# Patient Record
Sex: Female | Born: 1970 | Race: White | Hispanic: No | Marital: Married | Smoking: Never smoker
Health system: Southern US, Community
[De-identification: ages and names within clinical notes are randomized; demographics above are authoritative.]

## PROBLEM LIST (undated history)

## (undated) DIAGNOSIS — D649 Anemia, unspecified: Secondary | ICD-10-CM

## (undated) DIAGNOSIS — E079 Disorder of thyroid, unspecified: Secondary | ICD-10-CM

---

## 1997-07-22 ENCOUNTER — Other Ambulatory Visit: Admission: RE | Admit: 1997-07-22 | Discharge: 1997-07-22 | Payer: Self-pay | Admitting: Obstetrics & Gynecology

## 1998-06-02 ENCOUNTER — Other Ambulatory Visit: Admission: RE | Admit: 1998-06-02 | Discharge: 1998-06-02 | Payer: Self-pay | Admitting: Obstetrics and Gynecology

## 1998-08-10 ENCOUNTER — Other Ambulatory Visit: Admission: RE | Admit: 1998-08-10 | Discharge: 1998-08-10 | Payer: Self-pay | Admitting: Obstetrics and Gynecology

## 1999-04-07 ENCOUNTER — Inpatient Hospital Stay (HOSPITAL_COMMUNITY): Admission: AD | Admit: 1999-04-07 | Discharge: 1999-04-07 | Payer: Self-pay | Admitting: Obstetrics and Gynecology

## 1999-06-22 ENCOUNTER — Inpatient Hospital Stay (HOSPITAL_COMMUNITY): Admission: AD | Admit: 1999-06-22 | Discharge: 1999-06-25 | Payer: Self-pay | Admitting: Obstetrics and Gynecology

## 1999-07-28 ENCOUNTER — Other Ambulatory Visit: Admission: RE | Admit: 1999-07-28 | Discharge: 1999-07-28 | Payer: Self-pay | Admitting: Obstetrics and Gynecology

## 1999-09-14 ENCOUNTER — Other Ambulatory Visit: Admission: RE | Admit: 1999-09-14 | Discharge: 1999-09-14 | Payer: Self-pay | Admitting: Obstetrics and Gynecology

## 2000-02-02 ENCOUNTER — Other Ambulatory Visit: Admission: RE | Admit: 2000-02-02 | Discharge: 2000-02-02 | Payer: Self-pay | Admitting: Obstetrics and Gynecology

## 2001-03-05 ENCOUNTER — Other Ambulatory Visit: Admission: RE | Admit: 2001-03-05 | Discharge: 2001-03-05 | Payer: Self-pay | Admitting: Obstetrics and Gynecology

## 2002-07-21 ENCOUNTER — Inpatient Hospital Stay (HOSPITAL_COMMUNITY): Admission: AD | Admit: 2002-07-21 | Discharge: 2002-07-24 | Payer: Self-pay | Admitting: Obstetrics and Gynecology

## 2002-07-25 ENCOUNTER — Encounter: Admission: RE | Admit: 2002-07-25 | Discharge: 2002-08-24 | Payer: Self-pay | Admitting: Obstetrics and Gynecology

## 2002-09-03 ENCOUNTER — Other Ambulatory Visit: Admission: RE | Admit: 2002-09-03 | Discharge: 2002-09-03 | Payer: Self-pay | Admitting: Obstetrics and Gynecology

## 2002-09-24 ENCOUNTER — Encounter: Admission: RE | Admit: 2002-09-24 | Discharge: 2002-10-24 | Payer: Self-pay | Admitting: Obstetrics and Gynecology

## 2002-11-24 ENCOUNTER — Encounter: Admission: RE | Admit: 2002-11-24 | Discharge: 2002-12-24 | Payer: Self-pay | Admitting: Obstetrics and Gynecology

## 2003-12-02 ENCOUNTER — Other Ambulatory Visit: Admission: RE | Admit: 2003-12-02 | Discharge: 2003-12-02 | Payer: Self-pay | Admitting: Obstetrics and Gynecology

## 2004-11-02 ENCOUNTER — Encounter: Admission: RE | Admit: 2004-11-02 | Discharge: 2004-11-02 | Payer: Self-pay | Admitting: Obstetrics and Gynecology

## 2004-12-06 ENCOUNTER — Encounter (HOSPITAL_COMMUNITY): Admission: RE | Admit: 2004-12-06 | Discharge: 2005-03-06 | Payer: Self-pay | Admitting: Endocrinology

## 2005-02-14 ENCOUNTER — Other Ambulatory Visit: Admission: RE | Admit: 2005-02-14 | Discharge: 2005-02-14 | Payer: Self-pay | Admitting: Obstetrics and Gynecology

## 2010-04-24 ENCOUNTER — Encounter: Payer: Self-pay | Admitting: Endocrinology

## 2011-07-10 ENCOUNTER — Other Ambulatory Visit: Payer: Self-pay | Admitting: Obstetrics and Gynecology

## 2011-08-04 ENCOUNTER — Other Ambulatory Visit: Payer: Self-pay | Admitting: Obstetrics and Gynecology

## 2011-08-04 DIAGNOSIS — R928 Other abnormal and inconclusive findings on diagnostic imaging of breast: Secondary | ICD-10-CM

## 2011-08-14 ENCOUNTER — Ambulatory Visit
Admission: RE | Admit: 2011-08-14 | Discharge: 2011-08-14 | Disposition: A | Discharge status: Home / Self Care | Payer: BC Managed Care – PPO | Source: Ambulatory Visit | Attending: Obstetrics and Gynecology | Admitting: Obstetrics and Gynecology

## 2011-08-14 DIAGNOSIS — R928 Other abnormal and inconclusive findings on diagnostic imaging of breast: Secondary | ICD-10-CM

## 2011-11-24 ENCOUNTER — Other Ambulatory Visit: Payer: Self-pay | Admitting: Plastic Surgery

## 2012-07-23 ENCOUNTER — Other Ambulatory Visit: Payer: Self-pay | Admitting: Obstetrics and Gynecology

## 2012-08-06 ENCOUNTER — Other Ambulatory Visit (HOSPITAL_COMMUNITY): Payer: Self-pay | Admitting: Endocrinology

## 2012-08-06 DIAGNOSIS — E0590 Thyrotoxicosis, unspecified without thyrotoxic crisis or storm: Secondary | ICD-10-CM

## 2012-08-21 ENCOUNTER — Encounter (HOSPITAL_COMMUNITY)
Admission: RE | Admit: 2012-08-21 | Discharge: 2012-08-21 | Disposition: A | Discharge status: Home / Self Care | Payer: BC Managed Care – PPO | Source: Ambulatory Visit | Attending: Endocrinology | Admitting: Endocrinology

## 2012-08-21 DIAGNOSIS — E0590 Thyrotoxicosis, unspecified without thyrotoxic crisis or storm: Secondary | ICD-10-CM | POA: Insufficient documentation

## 2012-08-22 ENCOUNTER — Encounter (HOSPITAL_COMMUNITY): Payer: Self-pay

## 2012-08-22 ENCOUNTER — Encounter (HOSPITAL_COMMUNITY)
Admission: RE | Admit: 2012-08-22 | Discharge: 2012-08-22 | Disposition: A | Discharge status: Home / Self Care | Payer: BC Managed Care – PPO | Source: Ambulatory Visit | Attending: Endocrinology | Admitting: Endocrinology

## 2012-08-22 DIAGNOSIS — E0590 Thyrotoxicosis, unspecified without thyrotoxic crisis or storm: Secondary | ICD-10-CM | POA: Insufficient documentation

## 2012-08-22 MED ORDER — SODIUM IODIDE I 131 CAPSULE
14.9000 | Freq: Once | INTRAVENOUS | Status: AC | PRN
  Administered 2012-08-22: 14.9 via ORAL

## 2012-08-22 MED ORDER — SODIUM PERTECHNETATE TC 99M INJECTION
10.0000 | Freq: Once | INTRAVENOUS | Status: AC | PRN
  Administered 2012-08-22: 10 via INTRAVENOUS

## 2012-09-04 ENCOUNTER — Other Ambulatory Visit (HOSPITAL_COMMUNITY): Payer: Self-pay | Admitting: Endocrinology

## 2012-09-04 DIAGNOSIS — E0590 Thyrotoxicosis, unspecified without thyrotoxic crisis or storm: Secondary | ICD-10-CM

## 2012-09-16 ENCOUNTER — Encounter (HOSPITAL_COMMUNITY)
Admission: RE | Admit: 2012-09-16 | Discharge: 2012-09-16 | Disposition: A | Discharge status: Home / Self Care | Payer: BC Managed Care – PPO | Source: Ambulatory Visit | Attending: Endocrinology | Admitting: Endocrinology

## 2012-09-16 DIAGNOSIS — E0590 Thyrotoxicosis, unspecified without thyrotoxic crisis or storm: Secondary | ICD-10-CM | POA: Insufficient documentation

## 2012-09-16 MED ORDER — SODIUM IODIDE I 131 CAPSULE
20.9000 | Freq: Once | INTRAVENOUS | Status: AC | PRN
  Administered 2012-09-16: 20.9 via ORAL

## 2013-07-24 ENCOUNTER — Ambulatory Visit
Admission: RE | Admit: 2013-07-24 | Discharge: 2013-07-24 | Disposition: A | Discharge status: Home / Self Care | Payer: PRIVATE HEALTH INSURANCE | Source: Ambulatory Visit | Attending: Obstetrics and Gynecology | Admitting: Obstetrics and Gynecology

## 2013-07-24 ENCOUNTER — Encounter (INDEPENDENT_AMBULATORY_CARE_PROVIDER_SITE_OTHER): Payer: Self-pay

## 2013-07-24 ENCOUNTER — Other Ambulatory Visit: Payer: Self-pay | Admitting: Obstetrics and Gynecology

## 2013-07-24 DIAGNOSIS — N6320 Unspecified lump in the left breast, unspecified quadrant: Principal | ICD-10-CM

## 2013-07-24 DIAGNOSIS — N6325 Unspecified lump in the left breast, overlapping quadrants: Secondary | ICD-10-CM

## 2013-09-23 ENCOUNTER — Other Ambulatory Visit: Payer: Self-pay | Admitting: Obstetrics and Gynecology

## 2013-09-24 LAB — CYTOLOGY - PAP

## 2014-10-14 ENCOUNTER — Other Ambulatory Visit: Payer: Self-pay | Admitting: Obstetrics and Gynecology

## 2014-10-15 LAB — CYTOLOGY - PAP

## 2015-03-05 ENCOUNTER — Other Ambulatory Visit: Payer: Self-pay | Admitting: Plastic Surgery

## 2015-07-08 DIAGNOSIS — E890 Postprocedural hypothyroidism: Secondary | ICD-10-CM | POA: Diagnosis not present

## 2015-07-08 DIAGNOSIS — E611 Iron deficiency: Secondary | ICD-10-CM | POA: Diagnosis not present

## 2015-07-20 DIAGNOSIS — J301 Allergic rhinitis due to pollen: Secondary | ICD-10-CM | POA: Diagnosis not present

## 2015-07-20 DIAGNOSIS — J3081 Allergic rhinitis due to animal (cat) (dog) hair and dander: Secondary | ICD-10-CM | POA: Diagnosis not present

## 2015-07-20 DIAGNOSIS — J3089 Other allergic rhinitis: Secondary | ICD-10-CM | POA: Diagnosis not present

## 2015-08-12 DIAGNOSIS — J3081 Allergic rhinitis due to animal (cat) (dog) hair and dander: Secondary | ICD-10-CM | POA: Diagnosis not present

## 2015-08-12 DIAGNOSIS — J3089 Other allergic rhinitis: Secondary | ICD-10-CM | POA: Diagnosis not present

## 2015-08-12 DIAGNOSIS — J301 Allergic rhinitis due to pollen: Secondary | ICD-10-CM | POA: Diagnosis not present

## 2015-08-18 DIAGNOSIS — J3081 Allergic rhinitis due to animal (cat) (dog) hair and dander: Secondary | ICD-10-CM | POA: Diagnosis not present

## 2015-08-18 DIAGNOSIS — J301 Allergic rhinitis due to pollen: Secondary | ICD-10-CM | POA: Diagnosis not present

## 2015-08-18 DIAGNOSIS — J3089 Other allergic rhinitis: Secondary | ICD-10-CM | POA: Diagnosis not present

## 2015-09-07 ENCOUNTER — Other Ambulatory Visit: Payer: Self-pay | Admitting: Obstetrics and Gynecology

## 2015-09-07 DIAGNOSIS — N63 Unspecified lump in unspecified breast: Secondary | ICD-10-CM

## 2015-09-14 ENCOUNTER — Ambulatory Visit
Admission: RE | Admit: 2015-09-14 | Discharge: 2015-09-14 | Disposition: A | Discharge status: Home / Self Care | Payer: BLUE CROSS/BLUE SHIELD | Source: Ambulatory Visit | Attending: Obstetrics and Gynecology | Admitting: Obstetrics and Gynecology

## 2015-09-14 DIAGNOSIS — N63 Unspecified lump in unspecified breast: Secondary | ICD-10-CM

## 2015-09-14 DIAGNOSIS — R922 Inconclusive mammogram: Secondary | ICD-10-CM | POA: Diagnosis not present

## 2015-11-23 DIAGNOSIS — D225 Melanocytic nevi of trunk: Secondary | ICD-10-CM | POA: Diagnosis not present

## 2015-11-23 DIAGNOSIS — D2271 Melanocytic nevi of right lower limb, including hip: Secondary | ICD-10-CM | POA: Diagnosis not present

## 2015-11-23 DIAGNOSIS — Z86018 Personal history of other benign neoplasm: Secondary | ICD-10-CM | POA: Diagnosis not present

## 2015-11-23 DIAGNOSIS — D2272 Melanocytic nevi of left lower limb, including hip: Secondary | ICD-10-CM | POA: Diagnosis not present

## 2015-12-14 DIAGNOSIS — Z01419 Encounter for gynecological examination (general) (routine) without abnormal findings: Secondary | ICD-10-CM | POA: Diagnosis not present

## 2015-12-14 DIAGNOSIS — Z6822 Body mass index (BMI) 22.0-22.9, adult: Secondary | ICD-10-CM | POA: Diagnosis not present

## 2015-12-14 DIAGNOSIS — D509 Iron deficiency anemia, unspecified: Secondary | ICD-10-CM | POA: Diagnosis not present

## 2015-12-28 DIAGNOSIS — D696 Thrombocytopenia, unspecified: Secondary | ICD-10-CM | POA: Diagnosis not present

## 2016-02-21 DIAGNOSIS — E890 Postprocedural hypothyroidism: Secondary | ICD-10-CM | POA: Diagnosis not present

## 2016-05-18 DIAGNOSIS — E890 Postprocedural hypothyroidism: Secondary | ICD-10-CM | POA: Diagnosis not present

## 2016-05-25 DIAGNOSIS — E890 Postprocedural hypothyroidism: Secondary | ICD-10-CM | POA: Diagnosis not present

## 2016-05-25 DIAGNOSIS — E611 Iron deficiency: Secondary | ICD-10-CM | POA: Diagnosis not present

## 2016-08-01 DIAGNOSIS — R7301 Impaired fasting glucose: Secondary | ICD-10-CM | POA: Diagnosis not present

## 2016-08-01 DIAGNOSIS — E611 Iron deficiency: Secondary | ICD-10-CM | POA: Diagnosis not present

## 2016-08-01 DIAGNOSIS — E890 Postprocedural hypothyroidism: Secondary | ICD-10-CM | POA: Diagnosis not present

## 2017-01-01 DIAGNOSIS — Z6821 Body mass index (BMI) 21.0-21.9, adult: Secondary | ICD-10-CM | POA: Diagnosis not present

## 2017-01-01 DIAGNOSIS — Z1212 Encounter for screening for malignant neoplasm of rectum: Secondary | ICD-10-CM | POA: Diagnosis not present

## 2017-01-01 DIAGNOSIS — D2271 Melanocytic nevi of right lower limb, including hip: Secondary | ICD-10-CM | POA: Diagnosis not present

## 2017-01-01 DIAGNOSIS — Z1231 Encounter for screening mammogram for malignant neoplasm of breast: Secondary | ICD-10-CM | POA: Diagnosis not present

## 2017-01-01 DIAGNOSIS — D225 Melanocytic nevi of trunk: Secondary | ICD-10-CM | POA: Diagnosis not present

## 2017-01-01 DIAGNOSIS — D2272 Melanocytic nevi of left lower limb, including hip: Secondary | ICD-10-CM | POA: Diagnosis not present

## 2017-01-01 DIAGNOSIS — Z01419 Encounter for gynecological examination (general) (routine) without abnormal findings: Secondary | ICD-10-CM | POA: Diagnosis not present

## 2017-01-01 DIAGNOSIS — Z86018 Personal history of other benign neoplasm: Secondary | ICD-10-CM | POA: Diagnosis not present

## 2017-05-17 DIAGNOSIS — E611 Iron deficiency: Secondary | ICD-10-CM | POA: Diagnosis not present

## 2017-05-17 DIAGNOSIS — E890 Postprocedural hypothyroidism: Secondary | ICD-10-CM | POA: Diagnosis not present

## 2017-06-11 DIAGNOSIS — E890 Postprocedural hypothyroidism: Secondary | ICD-10-CM | POA: Diagnosis not present

## 2017-06-11 DIAGNOSIS — E611 Iron deficiency: Secondary | ICD-10-CM | POA: Diagnosis not present

## 2018-01-07 ENCOUNTER — Other Ambulatory Visit: Payer: Self-pay | Admitting: Obstetrics and Gynecology

## 2018-01-07 DIAGNOSIS — R928 Other abnormal and inconclusive findings on diagnostic imaging of breast: Secondary | ICD-10-CM

## 2018-01-09 ENCOUNTER — Ambulatory Visit
Admission: RE | Admit: 2018-01-09 | Discharge: 2018-01-09 | Disposition: A | Discharge status: Home / Self Care | Payer: BLUE CROSS/BLUE SHIELD | Source: Ambulatory Visit | Attending: Obstetrics and Gynecology | Admitting: Obstetrics and Gynecology

## 2018-01-09 DIAGNOSIS — R928 Other abnormal and inconclusive findings on diagnostic imaging of breast: Secondary | ICD-10-CM

## 2018-01-21 ENCOUNTER — Other Ambulatory Visit (HOSPITAL_COMMUNITY): Payer: Self-pay

## 2018-01-22 ENCOUNTER — Ambulatory Visit (HOSPITAL_COMMUNITY): Payer: BLUE CROSS/BLUE SHIELD

## 2018-01-25 ENCOUNTER — Encounter (HOSPITAL_COMMUNITY): Payer: BLUE CROSS/BLUE SHIELD

## 2019-11-24 ENCOUNTER — Other Ambulatory Visit: Payer: Self-pay | Admitting: Physician Assistant

## 2019-11-24 ENCOUNTER — Telehealth: Payer: Self-pay | Admitting: Physician Assistant

## 2019-11-24 ENCOUNTER — Ambulatory Visit (HOSPITAL_COMMUNITY)
Admission: RE | Admit: 2019-11-24 | Discharge: 2019-11-24 | Disposition: A | Discharge status: Home / Self Care | Payer: BC Managed Care – PPO | Source: Ambulatory Visit | Attending: Pulmonary Disease | Admitting: Pulmonary Disease

## 2019-11-24 DIAGNOSIS — U071 COVID-19: Secondary | ICD-10-CM

## 2019-11-24 DIAGNOSIS — E079 Disorder of thyroid, unspecified: Secondary | ICD-10-CM

## 2019-11-24 DIAGNOSIS — J4520 Mild intermittent asthma, uncomplicated: Secondary | ICD-10-CM

## 2019-11-24 MED ORDER — EPINEPHRINE 0.3 MG/0.3ML IJ SOAJ: 0.3000 mg | Freq: Once | INTRAMUSCULAR | Status: DC | PRN

## 2019-11-24 MED ORDER — SODIUM CHLORIDE 0.9 % IV SOLN: INTRAVENOUS | Status: DC | PRN

## 2019-11-24 MED ORDER — DIPHENHYDRAMINE HCL 50 MG/ML IJ SOLN: 50.0000 mg | Freq: Once | INTRAMUSCULAR | Status: DC | PRN

## 2019-11-24 MED ORDER — METHYLPREDNISOLONE SODIUM SUCC 125 MG IJ SOLR: 125.0000 mg | Freq: Once | INTRAMUSCULAR | Status: DC | PRN

## 2019-11-24 MED ORDER — FAMOTIDINE IN NACL 20-0.9 MG/50ML-% IV SOLN: 20.0000 mg | Freq: Once | INTRAVENOUS | Status: DC | PRN

## 2019-11-24 MED ORDER — SODIUM CHLORIDE 0.9 % IV SOLN
1200.0000 mg | Freq: Once | INTRAVENOUS | Status: AC
  Administered 2019-11-24: 1200 mg via INTRAVENOUS
  Filled 2019-11-24: qty 10

## 2019-11-24 MED ORDER — ALBUTEROL SULFATE HFA 108 (90 BASE) MCG/ACT IN AERS: 2.0000 | INHALATION_SPRAY | Freq: Once | RESPIRATORY_TRACT | Status: DC | PRN

## 2019-11-24 NOTE — Telephone Encounter (Signed)
I connected by phone with Christina Trujillo on 11/24/2019 at 10:35 AM to discuss the potential use of a new treatment for mild to moderate COVID-19 viral infection in non-hospitalized patients.  This patient is a 49 y.o. female that meets the FDA criteria for Emergency Use Authorization of COVID monoclonal antibody casirivimab/imdevimab.  Has a (+) direct SARS-CoV-2 viral test result  Has mild or moderate COVID-19   Is NOT hospitalized due to COVID-19  Is within 10 days of symptom onset  Has at least one of the high risk factor(s) for progression to severe COVID-19 and/or hospitalization as defined in EUA.  Specific high risk criteria : Chronic Lung Disease     I have spoken and communicated the following to the patient or parent/caregiver regarding COVID monoclonal antibody treatment:  1. FDA has authorized the emergency use for the treatment of mild to moderate COVID-19 in adults and pediatric patients with positive results of direct SARS-CoV-2 viral testing who are 39 years of age and older weighing at least 40 kg, and who are at high risk for progressing to severe COVID-19 and/or hospitalization.  2. The significant known and potential risks and benefits of COVID monoclonal antibody, and the extent to which such potential risks and benefits are unknown.  3. Information on available alternative treatments and the risks and benefits of those alternatives, including clinical trials.  4. Patients treated with COVID monoclonal antibody should continue to self-isolate and use infection control measures (e.g., wear mask, isolate, social distance, avoid sharing personal items, clean and disinfect "high touch" surfaces, and frequent handwashing) according to CDC guidelines.   5. The patient or parent/caregiver has the option to accept or refuse COVID monoclonal antibody treatment.  After reviewing this information with the patient, The patient agreed to proceed with receiving  casirivimab\imdevimab infusion and will be provided a copy of the Fact sheet prior to receiving the infusion. [name] 11/24/2019 10:35 AM

## 2019-11-24 NOTE — Progress Notes (Signed)
  Diagnosis: COVID-19  Physician:patrick wright  Procedure: [facility] In[facility] Med: casirivimab\imdevimab infusion - Provided patient with casirivimab\imdevimab fact sheet for patients, parents and caregivers prior to infusion.  Complications: No immediate complications noted.  Discharge: Discharged home   Shaune Spittle 11/24/2019

## 2019-11-24 NOTE — Discharge Instructions (Signed)

## 2019-12-04 ENCOUNTER — Emergency Department (HOSPITAL_COMMUNITY): Payer: BC Managed Care – PPO

## 2019-12-04 ENCOUNTER — Emergency Department (HOSPITAL_COMMUNITY)
Admission: EM | Admit: 2019-12-04 | Discharge: 2019-12-04 | Disposition: A | Discharge status: Home / Self Care | Payer: BC Managed Care – PPO | Attending: Emergency Medicine | Admitting: Emergency Medicine

## 2019-12-04 ENCOUNTER — Other Ambulatory Visit: Payer: Self-pay

## 2019-12-04 ENCOUNTER — Encounter (HOSPITAL_COMMUNITY): Payer: Self-pay

## 2019-12-04 DIAGNOSIS — M25511 Pain in right shoulder: Secondary | ICD-10-CM | POA: Diagnosis not present

## 2019-12-04 DIAGNOSIS — R079 Chest pain, unspecified: Secondary | ICD-10-CM | POA: Diagnosis present

## 2019-12-04 DIAGNOSIS — J189 Pneumonia, unspecified organism: Secondary | ICD-10-CM

## 2019-12-04 DIAGNOSIS — J181 Lobar pneumonia, unspecified organism: Secondary | ICD-10-CM | POA: Diagnosis not present

## 2019-12-04 HISTORY — DX: Anemia, unspecified: D64.9

## 2019-12-04 HISTORY — DX: Disorder of thyroid, unspecified: E07.9

## 2019-12-04 LAB — BASIC METABOLIC PANEL
Anion gap: 6 (ref 5–15)
BUN: 8 mg/dL (ref 6–20)
CO2: 26 mmol/L (ref 22–32)
Calcium: 8.7 mg/dL — ABNORMAL LOW (ref 8.9–10.3)
Chloride: 107 mmol/L (ref 98–111)
Creatinine, Ser: 0.72 mg/dL (ref 0.44–1.00)
GFR calc Af Amer: >60 mL/min (ref >60)
GFR calc non Af Amer: >60 mL/min (ref >60)
Glucose, Bld: 96 mg/dL (ref 70–99)
Potassium: 4.8 mmol/L (ref 3.5–5.1)
Sodium: 139 mmol/L (ref 135–145)

## 2019-12-04 LAB — CBC
HCT: 33 % — ABNORMAL LOW (ref 36.0–46.0)
Hemoglobin: 10.8 g/dL — ABNORMAL LOW (ref 12.0–15.0)
MCH: 31.4 pg (ref 26.0–34.0)
MCHC: 32.7 g/dL (ref 30.0–36.0)
MCV: 95.9 fL (ref 80.0–100.0)
Platelets: 206 10*3/uL (ref 150–400)
RBC: 3.44 MIL/uL — ABNORMAL LOW (ref 3.87–5.11)
RDW: 12 % (ref 11.5–15.5)
WBC: 5.9 10*3/uL (ref 4.0–10.5)
nRBC: 0 % (ref 0.0–0.2)

## 2019-12-04 LAB — TROPONIN I (HIGH SENSITIVITY)
Troponin I (High Sensitivity): 3 ng/L (ref <18)
Troponin I (High Sensitivity): 3 ng/L (ref <18)

## 2019-12-04 LAB — I-STAT BETA HCG BLOOD, ED (MC, WL, AP ONLY): I-stat hCG, quantitative: <5 m[IU]/mL (ref <5)

## 2019-12-04 LAB — D-DIMER, QUANTITATIVE: D-Dimer, Quant: 0.36 ug/mL-FEU (ref 0.00–0.50)

## 2019-12-04 MED ORDER — SODIUM CHLORIDE 0.9 % IV BOLUS
1000.0000 mL | Freq: Once | INTRAVENOUS | Status: AC
  Administered 2019-12-04: 1000 mL via INTRAVENOUS

## 2019-12-04 NOTE — ED Notes (Signed)
Patient verbalizes understanding of discharge instructions. Opportunity for questioning and answers were provided. Armband removed by staff, pt discharged from ED and ambulated to lobby to return home with spouse.   

## 2019-12-04 NOTE — Discharge Instructions (Addendum)
Recommend you have repeat chest x-ray in 4 weeks to see if your pneumonia has improved.  Use the incentive spirometer every hour while you are awake as we discussed.  This encourages you to take deep breaths and expand her lungs fully.  It is important that you stay well-hydrated.  You should drink lots of water, Gatorade.  You can also drink Pedialyte which has electrolytes in it. -You should try to eat small meals throughout the day as we discussed.  If your blood pressure is low and you are feeling symptomatic call 911.  Return to the emergency department for any new or worsening symptoms.  Thank you for allowing [place] to be part of your care.

## 2019-12-04 NOTE — ED Provider Notes (Signed)
[facility] EMERGENCY DEPARTMENT Provider Note   CSN: [id number] Arrival date & time: 12/04/19  1347     History Chief Complaint  Patient presents with  . Chest Pain    Christina Trujillo is a 49 y.o. female with past medical history significant for anemia.   HPI Patient presents to emergency department today via EMS from UC with chief complaint of right sided chest pain that started this morning when she was laying in bed.  She states the pain radiates to her right shoulder.  She describes pain as an aching sensation.  She rates the pain 2 out of 10 in severity.  She denies worsening pain on exertion.  Patient tested positive for Covid x18 days ago.  After her quarantine ended she had begun to feel better however over the last 3 days has had generalized weakness.  She spent most of the last 3 days in bed.  She has been trying to stay hydrated and drink plenty of fluids.  She has had decreased appetite since her Covid diagnosis. She had an x-ray at urgent care last week that showed pneumonia.  She was prescribed azithromycin and a steroid Dosepak/doxycycline.  She has finished the Z-Pak and steroids.  She denies any fever, chills, cough, hemoptysis, shortness of breath, syncope, palpitations, abdominal pain, nausea, urinary symptoms, diarrhea.  Past Medical History:  Diagnosis Date  . Anemia   . Thyroid disease     There are no problems to display for this patient.   Past Surgical History:  Procedure Laterality Date  . CESAREAN SECTION       OB History   No obstetric history on file.     History reviewed. No pertinent family history.  Social History   Tobacco Use  . Smoking status: Never Smoker  . Smokeless tobacco: Never Used  Substance Use Topics  . Alcohol use: Yes    Comment: social   . Drug use: Never    Home Medications Prior to Admission medications   Not on File    Allergies    Amantadines  Review of Systems   Review of Systems  All  other systems are reviewed and are negative for acute change except as noted in the HPI.  Physical Exam Updated Vital Signs BP 105/60 (BP Location: Right Arm)   Pulse 78   Temp 98.2 F (36.8 C) (Oral)   Resp 16   Ht 5\' 8"  (1.727 m)   Wt 59 kg   LMP 11/13/2019   SpO2 100%   BMI 19.77 kg/m   Physical Exam Vitals and nursing note reviewed.  Constitutional:      General: She is not in acute distress.    Appearance: She is not ill-appearing or toxic-appearing.  HENT:     Head: Normocephalic and atraumatic.     Right Ear: Tympanic membrane and external ear normal.     Left Ear: Tympanic membrane and external ear normal.     Nose: Nose normal.     Mouth/Throat:     Mouth: Mucous membranes are moist.     Pharynx: Oropharynx is clear.  Eyes:     General: No scleral icterus.       Right eye: No discharge.        Left eye: No discharge.     Extraocular Movements: Extraocular movements intact.     Conjunctiva/sclera: Conjunctivae normal.     Pupils: Pupils are equal, round, and reactive to light.  Neck:  Vascular: No JVD.  Cardiovascular:     Rate and Rhythm: Normal rate and regular rhythm.     Pulses: Normal pulses.          Radial pulses are 2+ on the right side and 2+ on the left side.     Heart sounds: Normal heart sounds.  Pulmonary:     Comments: Lungs clear to auscultation in all fields. Symmetric chest rise. No wheezing, rales, or rhonchi.  Oxygen saturation is 99% percent on room air. Abdominal:     Comments: Abdomen is soft, non-distended, and non-tender in all quadrants. No rigidity, no guarding. No peritoneal signs.  Musculoskeletal:        General: Normal range of motion.     Cervical back: Normal range of motion.     Comments: Homans sign absent bilaterally, no lower extremity edema, no palpable cords, compartments are soft  Skin:    General: Skin is warm and dry.     Capillary Refill: Capillary refill takes less than 2 seconds.  Neurological:     Mental  Status: She is oriented to person, place, and time.     GCS: GCS eye subscore is 4. GCS verbal subscore is 5. GCS motor subscore is 6.     Comments: Fluent speech, no facial droop.  Psychiatric:        Behavior: Behavior normal.     ED Results / Procedures / Treatments   Labs (all labs ordered are listed, but only abnormal results are displayed) Labs Reviewed  BASIC METABOLIC PANEL - Abnormal; Notable for the following components:      Result Value   Calcium 8.7 (*)    All other components within normal limits  CBC - Abnormal; Notable for the following components:   RBC 3.44 (*)    Hemoglobin 10.8 (*)    HCT 33.0 (*)    All other components within normal limits  D-DIMER, QUANTITATIVE (NOT AT [facility])  I-STAT BETA HCG BLOOD, ED ([facility], [facility], [facility] ONLY)  TROPONIN I (HIGH SENSITIVITY)  TROPONIN I (HIGH SENSITIVITY)    EKG EKG Interpretation  Date/Time:  Thursday December 04 2019 14:09:42 EDT Ventricular Rate:  66 PR Interval:  142 QRS Duration: 80 QT Interval:  384 QTC Calculation: 402 R Axis:   90 Text Interpretation: Normal sinus rhythm with sinus arrhythmia Rightward axis Low voltage QRS Borderline ECG Confirmed by Blane Ohara [phone]) on 12/04/2019 7:03:39 PM   Radiology DG Chest 2 View  Result Date: 12/04/2019 CLINICAL DATA:  Covid neg now had covid pneumonia still feeling bad cough weak EXAM: CHEST - 2 VIEW COMPARISON:  None. FINDINGS: Cardiac silhouette is normal in size. Normal mediastinal and hilar contours. Hazy airspace opacities are noted in the right mid and lower lung, minimally at the left lung base, consistent with residual multifocal pneumonia. Remainder of the lungs is clear. No pleural effusion or pneumothorax. Skeletal structures are intact. IMPRESSION: 1. Areas of hazy airspace lung opacity most evident right mid to lower lung, consistent with residual pneumonia. Electronically Signed   By: Amie Portland M.D.   On: 12/04/2019 14:26    Procedures Procedures  (including critical care time)  Medications Ordered in ED Medications  sodium chloride 0.9 % bolus 1,000 mL (1,000 mLs Intravenous New Bag/Given 12/04/19 1924)    ED Course  I have reviewed the triage vital signs and the nursing notes.  Pertinent labs & imaging results that were available during my care of the patient were reviewed by me and  considered in my medical decision making (see chart for details).  Vitals:   12/04/19 1848 12/04/19 1849 12/04/19 2037 12/04/19 2045  BP: 107/75  105/60 105/63  Pulse: 70 68 78 79  Resp:  11 16 16   Temp:    97.6 F (36.4 C)  TempSrc:    Oral  SpO2: 100% 91% 100% 100%  Weight:      Height:          MDM Rules/Calculators/A&P                          History provided by patient with additional history obtained from chart review.    49 yo female presenting with chest pain. She was covid positive x 18 days ago, diagnosed with pneumonia at [facility].  Patient hypotensive at home.  Blood pressures here are soft. Exam is benign. Heart score of 1. EKG without ischemia. Delta troponin flat. Labs unremarkable. Orthostatics here negative.  CBC without leukocytosis, hemoglobin 10.8, known anemia. BMP with unremarkable.  Chest x-ray viewed by me shows right-sided pneumonia, this is where patient is having her chest pain making it likely cause. D-dimer is negative ,making PE unlikely cause of pain. Patient given liter IVF. Will discharge home with incentive spirometer and discussed symptomatic care.  The patient appears reasonably screened and/or stabilized for discharge and I doubt any other medical condition or other [facility] requiring further screening, evaluation, or treatment in the ED at this time prior to discharge. The patient is safe for discharge with strict return precautions discussed. Recommend pcp follow up for repeat chest xray in 1 month. The patient was discussed with and seen by Dr. [facility] who agrees with the treatment plan.   Portions of this note  were generated with Mikey Bussing. Dictation errors may occur despite best attempts at proofreading.  Final Clinical Impression(s) / ED Diagnoses Final diagnoses:  Pneumonia of right lung due to infectious organism, unspecified part of lung    Rx / DC Orders ED Discharge Orders    None       Scientist, clinical (histocompatibility and immunogenetics) 12/04/19 2123    2124, MD 12/05/19 [phone]

## 2019-12-04 NOTE — ED Triage Notes (Addendum)
Pt BIB [facility], [facility] sent her here for further evaluation. Tested covid + 18 days  Tested covid negative 3 days ago. Pt reports ongoing hypotensive, SBP 70-80's x4 days, dull Right chest/shoulder ache radiating down into her arm started today, dx w/Right side PNA, no pain with ROM or palpitation. Positive for OS changes  Finished one ABX and steroid dose pack Currently taking Doxycycline   324 mg ASA 500cc NS bolus  20g LAC  BP 108/72 HR 84 RR 16 CBG 100 98% RA  97.7

## 2020-03-05 DIAGNOSIS — U071 COVID-19: Secondary | ICD-10-CM | POA: Diagnosis not present

## 2020-03-05 DIAGNOSIS — J1282 Pneumonia due to coronavirus disease 2019: Secondary | ICD-10-CM | POA: Diagnosis not present

## 2020-04-05 DIAGNOSIS — E890 Postprocedural hypothyroidism: Secondary | ICD-10-CM | POA: Diagnosis not present

## 2020-04-07 DIAGNOSIS — E162 Hypoglycemia, unspecified: Secondary | ICD-10-CM | POA: Diagnosis not present

## 2020-04-07 DIAGNOSIS — E611 Iron deficiency: Secondary | ICD-10-CM | POA: Diagnosis not present

## 2020-04-07 DIAGNOSIS — E890 Postprocedural hypothyroidism: Secondary | ICD-10-CM | POA: Diagnosis not present

## 2020-04-21 DIAGNOSIS — L821 Other seborrheic keratosis: Secondary | ICD-10-CM | POA: Diagnosis not present

## 2020-04-21 DIAGNOSIS — D485 Neoplasm of uncertain behavior of skin: Secondary | ICD-10-CM | POA: Diagnosis not present

## 2020-05-01 IMAGING — MG DIGITAL DIAGNOSTIC UNILATERAL LEFT MAMMOGRAM WITH TOMO AND CAD
6 series · 6 of 18 positions shown · non-contrast
Comparison: 01/04/2018 and earlier

CLINICAL DATA: The patient returns after screening study for
evaluation of possible LEFT breast masses.

EXAM:
DIGITAL DIAGNOSTIC LEFT MAMMOGRAM WITH CAD AND TOMO
ULTRASOUND LEFT BREAST

[L MLO synth-2D (1 of 2)]
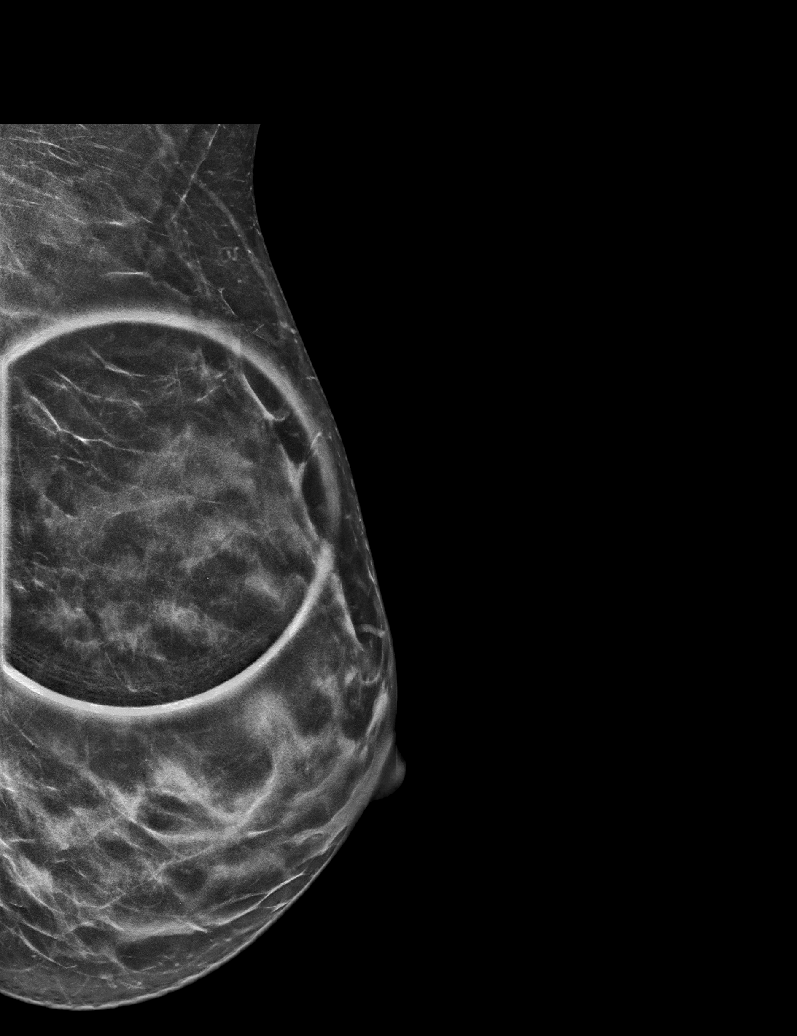

[L MLO synth-2D (2 of 2)]
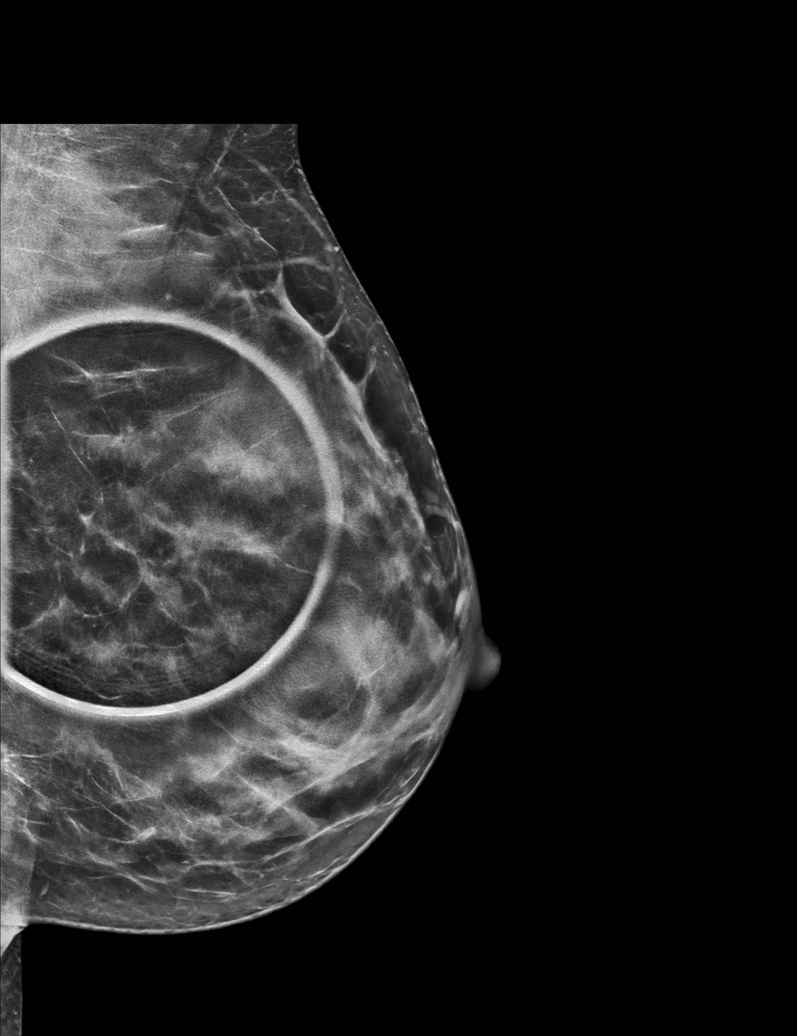

[L CC synth-2D]
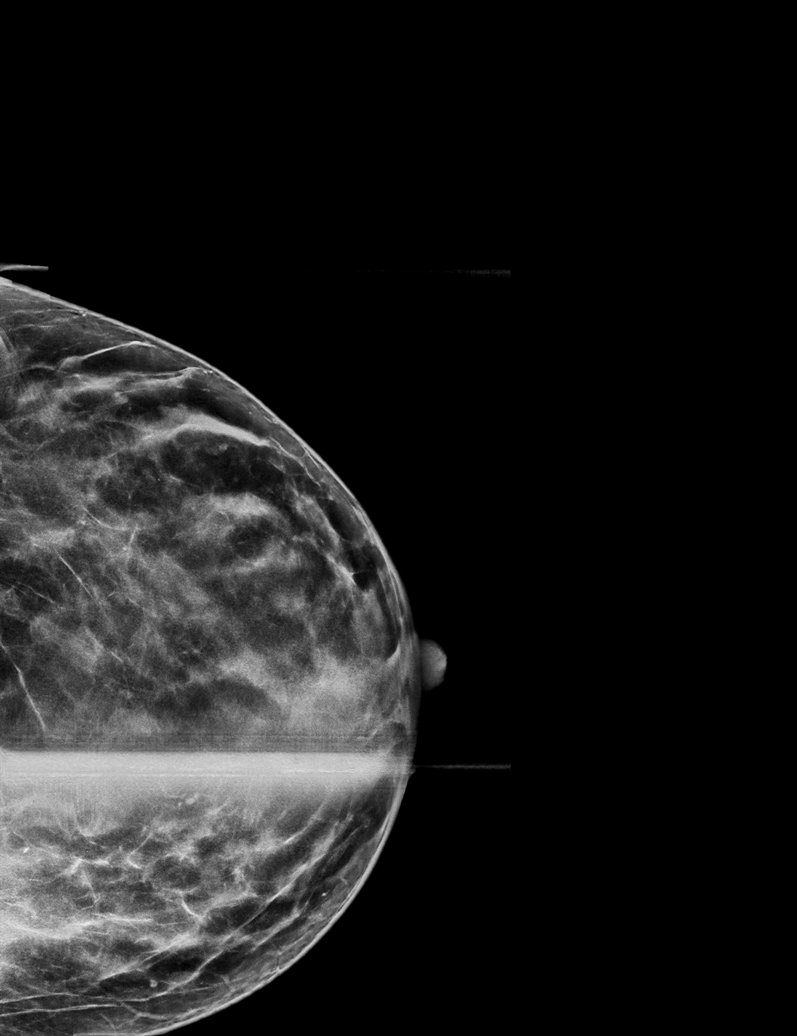

[L MLO tomo (1 of 2) · tomo slice 33/66.0]
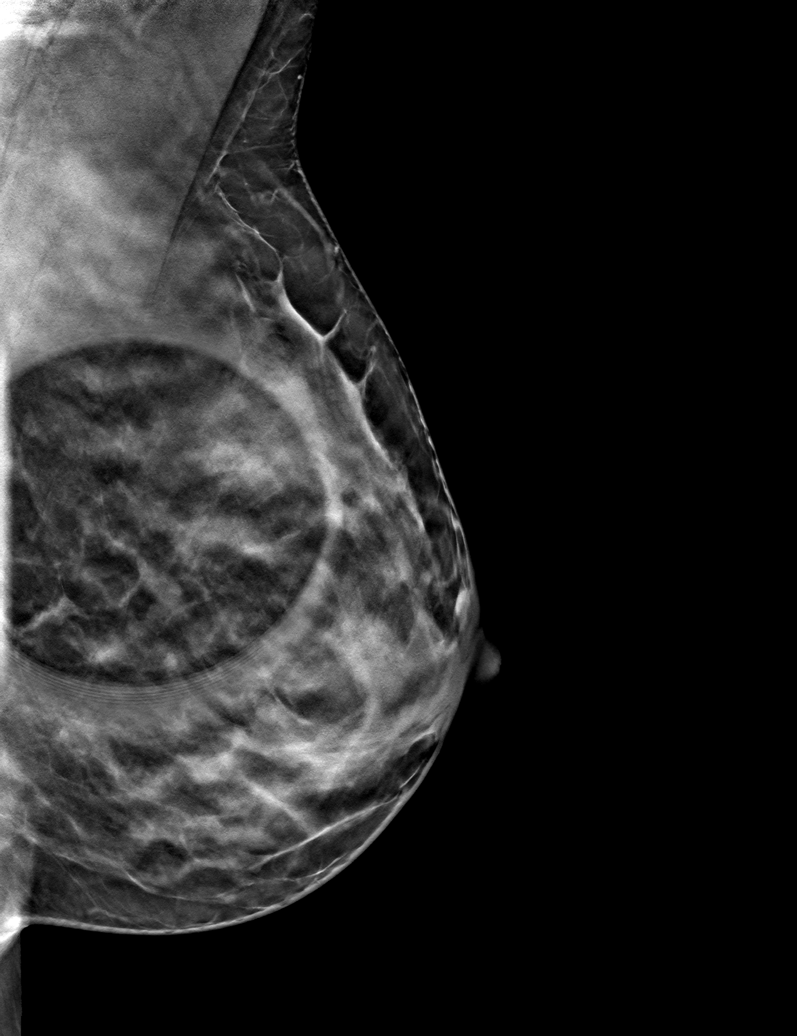

[L MLO tomo (2 of 2) · tomo slice 32/63.0]
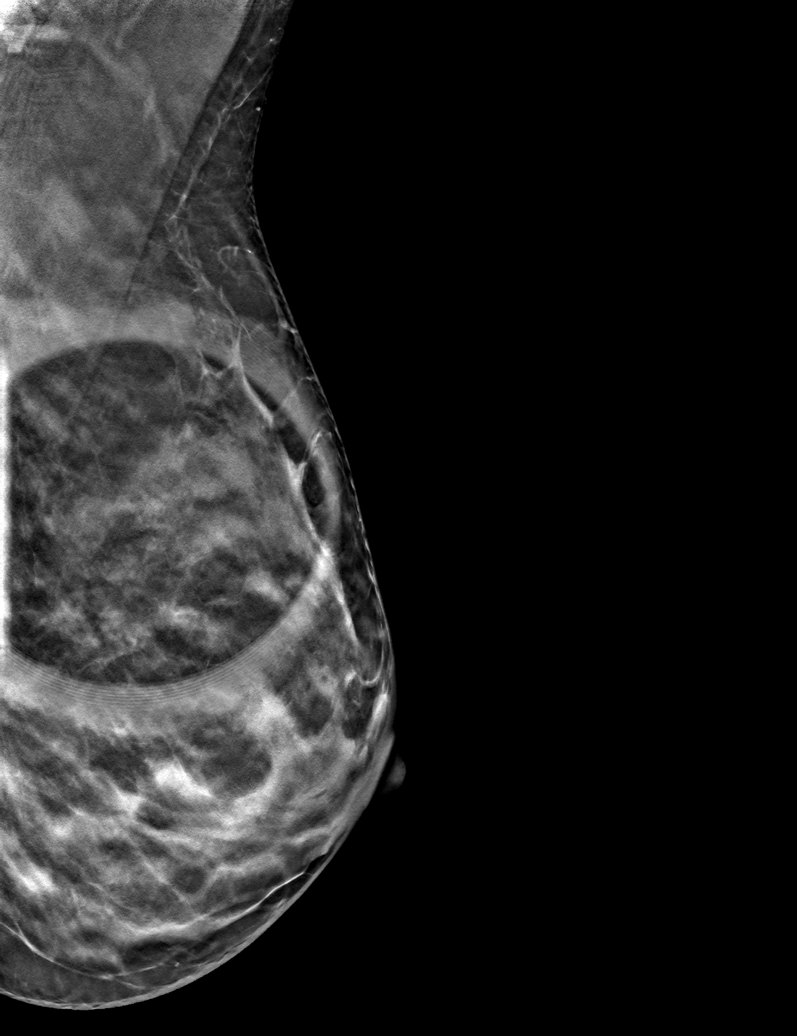

[L CC tomo · tomo slice 29/58.0]
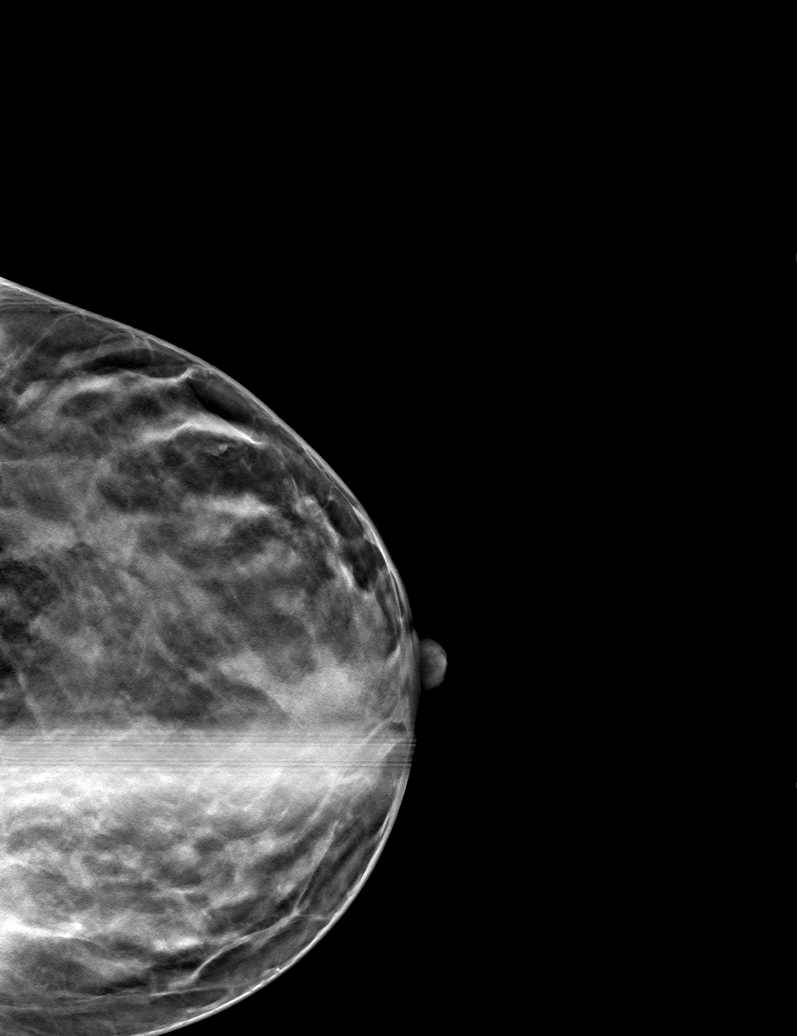

[6 of 18 positions shown; findings below may reference images not displayed]

ACR Breast Density Category c: The breast tissue is heterogeneously
dense, which may obscure small masses.
FINDINGS: Additional 2-D and 3-D images are performed. Within the LATERAL
portion of the LEFT breast, there are a partially obscured oval
masses, interspersed with dense fibroglandular tissue. No suspicious
mass or distortion identified.

Mammographic images were processed with CAD.

On physical exam, I palpate no abnormality in the LATERAL aspect of
the LEFT breast.

Targeted ultrasound is performed, showing numerous hypoechoic and
anechoic circumscribed oval parallel masses in the LATERAL quadrants
of the LEFT breast. Largest of these measures approximately 7
millimeters. No suspicious solid mass or acoustic shadowing.
IMPRESSION: Fibrocystic changes in the LEFT breast. No mammographic or
ultrasound evidence for malignancy.

RECOMMENDATION:
Screening mammogram in one year.(Code:[id number])

I have discussed the findings and recommendations with the patient.
Results were also provided in writing at the conclusion of the
visit. If applicable, a reminder letter will be sent to the patient
regarding the next appointment.

BI-RADS CATEGORY  2: Benign.

## 2020-06-08 DIAGNOSIS — E162 Hypoglycemia, unspecified: Secondary | ICD-10-CM | POA: Diagnosis not present

## 2020-06-08 DIAGNOSIS — E890 Postprocedural hypothyroidism: Secondary | ICD-10-CM | POA: Diagnosis not present

## 2020-06-23 DIAGNOSIS — Z6822 Body mass index (BMI) 22.0-22.9, adult: Secondary | ICD-10-CM | POA: Diagnosis not present

## 2020-06-23 DIAGNOSIS — Z1231 Encounter for screening mammogram for malignant neoplasm of breast: Secondary | ICD-10-CM | POA: Diagnosis not present

## 2020-06-23 DIAGNOSIS — N911 Secondary amenorrhea: Secondary | ICD-10-CM | POA: Diagnosis not present

## 2020-06-23 DIAGNOSIS — Z01419 Encounter for gynecological examination (general) (routine) without abnormal findings: Secondary | ICD-10-CM | POA: Diagnosis not present

## 2020-06-24 DIAGNOSIS — Z01419 Encounter for gynecological examination (general) (routine) without abnormal findings: Secondary | ICD-10-CM | POA: Diagnosis not present

## 2020-11-01 DIAGNOSIS — D225 Melanocytic nevi of trunk: Secondary | ICD-10-CM | POA: Diagnosis not present

## 2020-11-01 DIAGNOSIS — D485 Neoplasm of uncertain behavior of skin: Secondary | ICD-10-CM | POA: Diagnosis not present

## 2020-11-23 DIAGNOSIS — M2550 Pain in unspecified joint: Secondary | ICD-10-CM | POA: Diagnosis not present

## 2020-11-23 DIAGNOSIS — R946 Abnormal results of thyroid function studies: Secondary | ICD-10-CM | POA: Diagnosis not present

## 2020-11-23 DIAGNOSIS — D696 Thrombocytopenia, unspecified: Secondary | ICD-10-CM | POA: Diagnosis not present

## 2020-11-23 DIAGNOSIS — R197 Diarrhea, unspecified: Secondary | ICD-10-CM | POA: Diagnosis not present

## 2020-11-26 DIAGNOSIS — G4733 Obstructive sleep apnea (adult) (pediatric): Secondary | ICD-10-CM | POA: Diagnosis not present

## 2020-12-16 DIAGNOSIS — Z1211 Encounter for screening for malignant neoplasm of colon: Secondary | ICD-10-CM | POA: Diagnosis not present

## 2021-02-10 DIAGNOSIS — E039 Hypothyroidism, unspecified: Secondary | ICD-10-CM | POA: Diagnosis not present

## 2021-02-10 DIAGNOSIS — R946 Abnormal results of thyroid function studies: Secondary | ICD-10-CM | POA: Diagnosis not present

## 2021-02-10 DIAGNOSIS — D696 Thrombocytopenia, unspecified: Secondary | ICD-10-CM | POA: Diagnosis not present

## 2021-02-21 DIAGNOSIS — E611 Iron deficiency: Secondary | ICD-10-CM | POA: Diagnosis not present

## 2021-02-21 DIAGNOSIS — E890 Postprocedural hypothyroidism: Secondary | ICD-10-CM | POA: Diagnosis not present

## 2021-02-21 DIAGNOSIS — E162 Hypoglycemia, unspecified: Secondary | ICD-10-CM | POA: Diagnosis not present

## 2021-04-05 DIAGNOSIS — E119 Type 2 diabetes mellitus without complications: Secondary | ICD-10-CM | POA: Diagnosis not present

## 2021-04-05 DIAGNOSIS — E890 Postprocedural hypothyroidism: Secondary | ICD-10-CM | POA: Diagnosis not present

## 2021-04-05 DIAGNOSIS — E162 Hypoglycemia, unspecified: Secondary | ICD-10-CM | POA: Diagnosis not present

## 2021-04-05 DIAGNOSIS — E611 Iron deficiency: Secondary | ICD-10-CM | POA: Diagnosis not present

## 2021-04-07 DIAGNOSIS — D225 Melanocytic nevi of trunk: Secondary | ICD-10-CM | POA: Diagnosis not present

## 2021-04-07 DIAGNOSIS — D2272 Melanocytic nevi of left lower limb, including hip: Secondary | ICD-10-CM | POA: Diagnosis not present

## 2021-04-07 DIAGNOSIS — L814 Other melanin hyperpigmentation: Secondary | ICD-10-CM | POA: Diagnosis not present

## 2021-04-07 DIAGNOSIS — D2271 Melanocytic nevi of right lower limb, including hip: Secondary | ICD-10-CM | POA: Diagnosis not present

## 2021-04-12 DIAGNOSIS — E611 Iron deficiency: Secondary | ICD-10-CM | POA: Diagnosis not present

## 2021-04-12 DIAGNOSIS — E162 Hypoglycemia, unspecified: Secondary | ICD-10-CM | POA: Diagnosis not present

## 2021-04-12 DIAGNOSIS — D696 Thrombocytopenia, unspecified: Secondary | ICD-10-CM | POA: Diagnosis not present

## 2021-04-12 DIAGNOSIS — E890 Postprocedural hypothyroidism: Secondary | ICD-10-CM | POA: Diagnosis not present

## 2021-05-24 DIAGNOSIS — R946 Abnormal results of thyroid function studies: Secondary | ICD-10-CM | POA: Diagnosis not present

## 2021-05-24 DIAGNOSIS — D696 Thrombocytopenia, unspecified: Secondary | ICD-10-CM | POA: Diagnosis not present

## 2021-05-24 DIAGNOSIS — E039 Hypothyroidism, unspecified: Secondary | ICD-10-CM | POA: Diagnosis not present

## 2021-05-24 DIAGNOSIS — N951 Menopausal and female climacteric states: Secondary | ICD-10-CM | POA: Diagnosis not present

## 2021-06-06 DIAGNOSIS — N951 Menopausal and female climacteric states: Secondary | ICD-10-CM | POA: Diagnosis not present

## 2021-06-06 DIAGNOSIS — G4700 Insomnia, unspecified: Secondary | ICD-10-CM | POA: Diagnosis not present

## 2021-06-06 DIAGNOSIS — R5383 Other fatigue: Secondary | ICD-10-CM | POA: Diagnosis not present

## 2021-06-06 DIAGNOSIS — E039 Hypothyroidism, unspecified: Secondary | ICD-10-CM | POA: Diagnosis not present

## 2021-06-29 DIAGNOSIS — N959 Unspecified menopausal and perimenopausal disorder: Secondary | ICD-10-CM | POA: Diagnosis not present

## 2021-06-29 DIAGNOSIS — R21 Rash and other nonspecific skin eruption: Secondary | ICD-10-CM | POA: Diagnosis not present

## 2021-08-22 DIAGNOSIS — R61 Generalized hyperhidrosis: Secondary | ICD-10-CM | POA: Diagnosis not present

## 2021-08-22 DIAGNOSIS — Z1231 Encounter for screening mammogram for malignant neoplasm of breast: Secondary | ICD-10-CM | POA: Diagnosis not present

## 2021-08-22 DIAGNOSIS — Z6820 Body mass index (BMI) 20.0-20.9, adult: Secondary | ICD-10-CM | POA: Diagnosis not present

## 2021-08-22 DIAGNOSIS — N760 Acute vaginitis: Secondary | ICD-10-CM | POA: Diagnosis not present

## 2021-08-22 DIAGNOSIS — Z01419 Encounter for gynecological examination (general) (routine) without abnormal findings: Secondary | ICD-10-CM | POA: Diagnosis not present

## 2021-12-27 DIAGNOSIS — E039 Hypothyroidism, unspecified: Secondary | ICD-10-CM | POA: Diagnosis not present

## 2021-12-27 DIAGNOSIS — F419 Anxiety disorder, unspecified: Secondary | ICD-10-CM | POA: Diagnosis not present

## 2021-12-27 DIAGNOSIS — D696 Thrombocytopenia, unspecified: Secondary | ICD-10-CM | POA: Diagnosis not present

## 2021-12-27 DIAGNOSIS — N959 Unspecified menopausal and perimenopausal disorder: Secondary | ICD-10-CM | POA: Diagnosis not present

## 2022-03-26 IMAGING — DX DG CHEST 2V
2 series · 2 of 2 positions shown · non-contrast
Comparison: None.

CLINICAL DATA: Covid neg now had covid pneumonia still feeling bad
cough weak

EXAM:
CHEST - 2 VIEW

[chest pa]
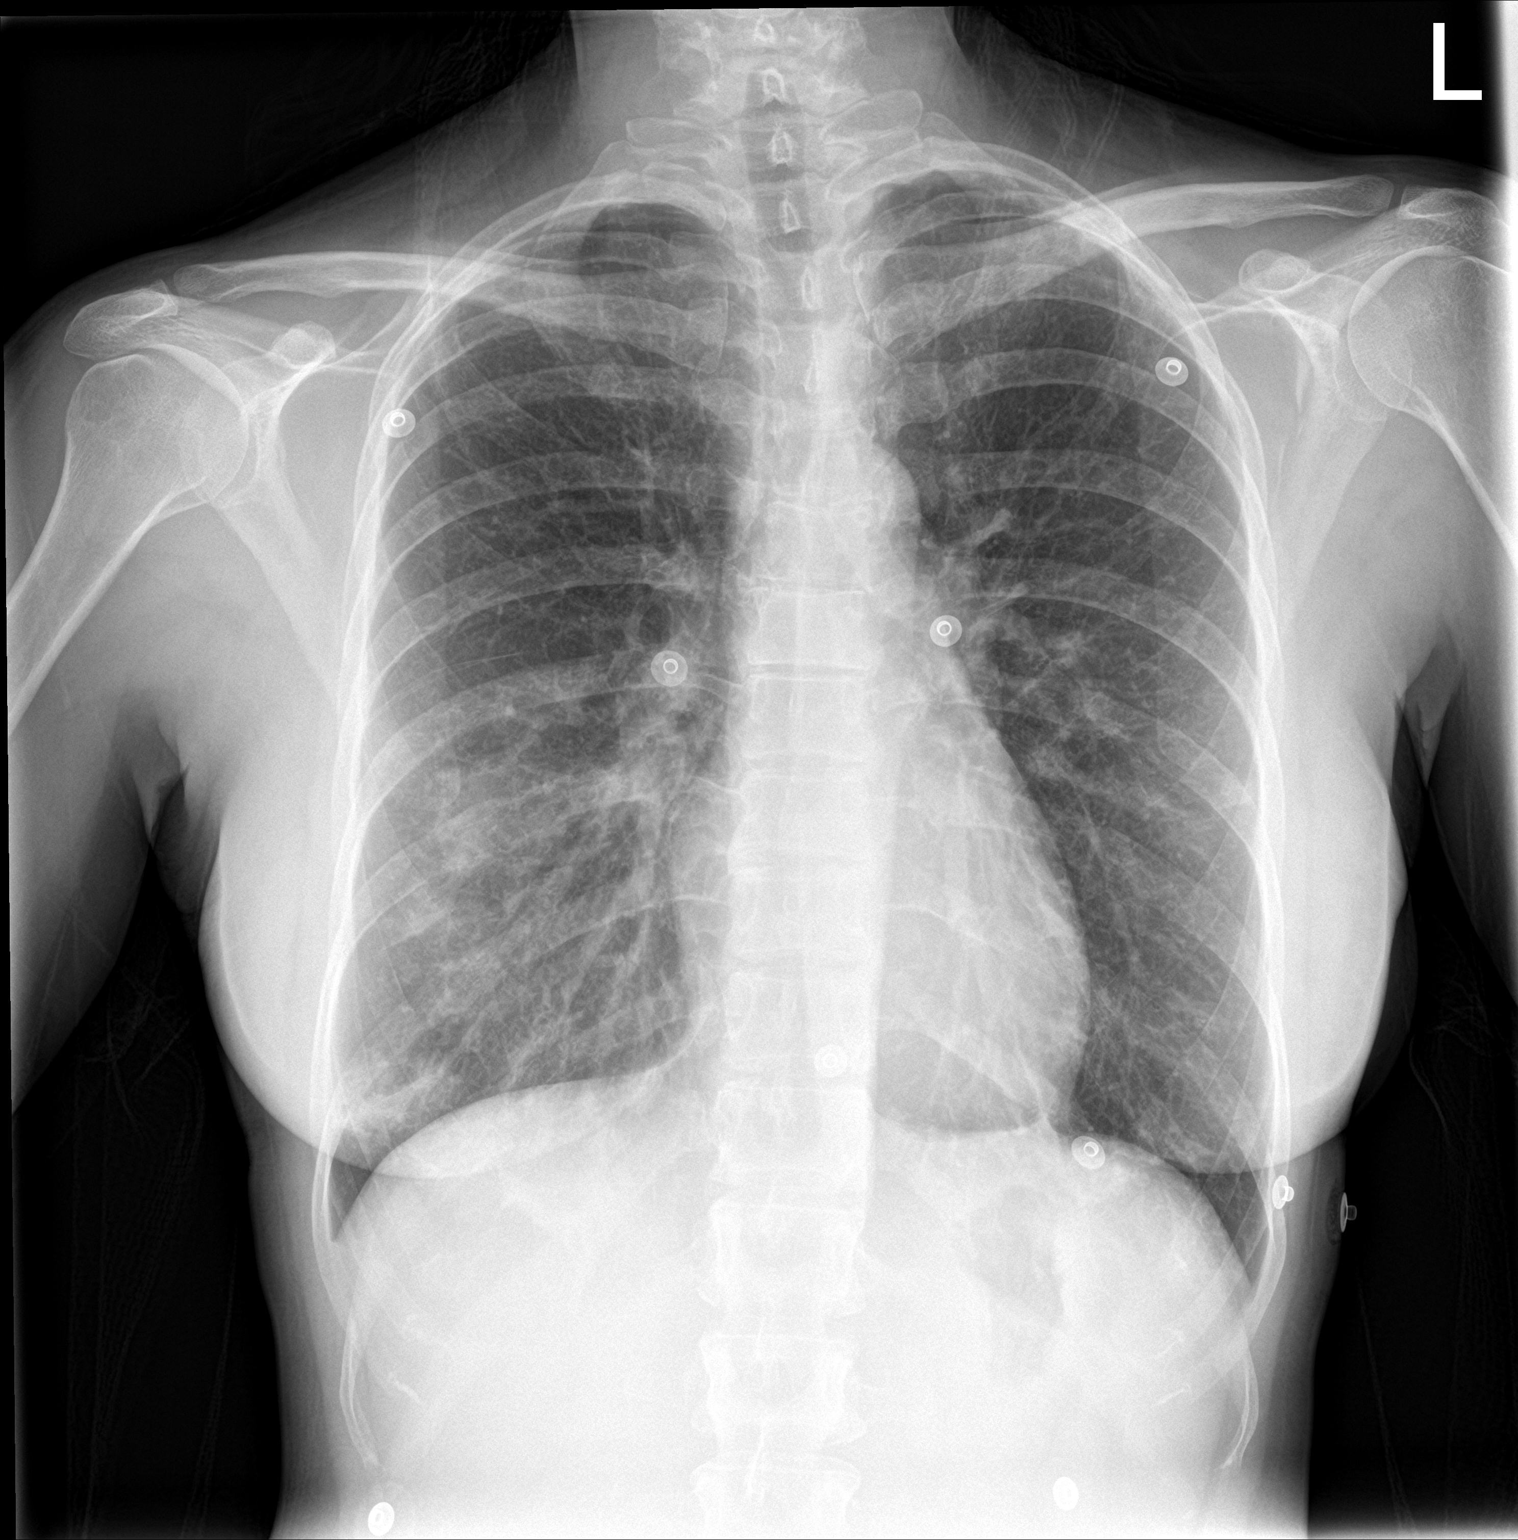

[chest lat]
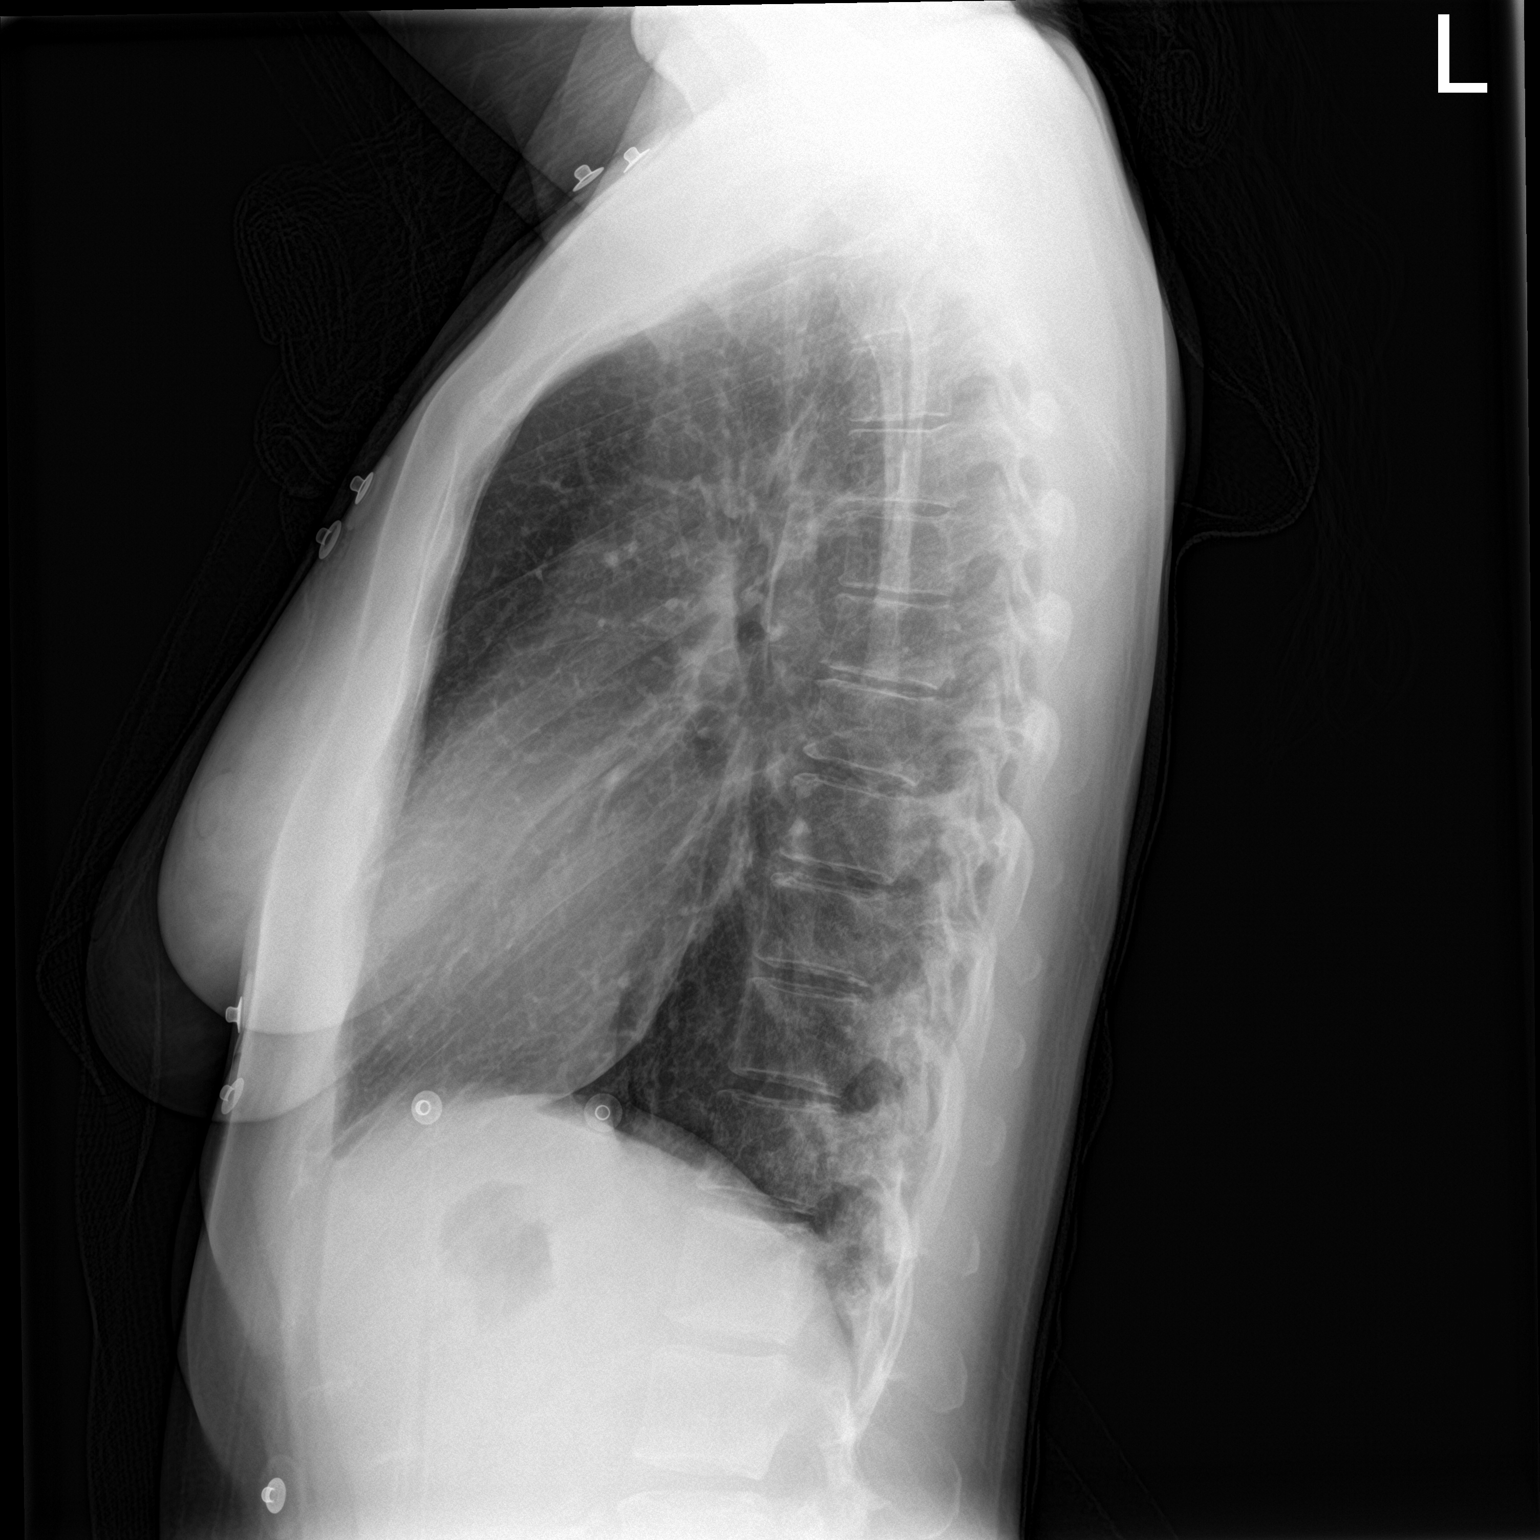

[2 of 2 positions shown; findings below may reference images not displayed]

FINDINGS: Cardiac silhouette is normal in size. Normal mediastinal and hilar
contours.

Hazy airspace opacities are noted in the right mid and lower lung,
minimally at the left lung base, consistent with residual multifocal
pneumonia. Remainder of the lungs is clear.

No pleural effusion or pneumothorax.

Skeletal structures are intact.
IMPRESSION: 1. Areas of hazy airspace lung opacity most evident right mid to
lower lung, consistent with residual pneumonia.
